# Patient Record
Sex: Male | Born: 1980 | Hispanic: No | Marital: Single | Smoking: Never smoker
Health system: Southern US, Community
[De-identification: ages and names within clinical notes are randomized; demographics above are authoritative.]

## PROBLEM LIST (undated history)

## (undated) DIAGNOSIS — L8990 Pressure ulcer of unspecified site, unspecified stage: Secondary | ICD-10-CM

## (undated) DIAGNOSIS — L089 Local infection of the skin and subcutaneous tissue, unspecified: Secondary | ICD-10-CM

## (undated) HISTORY — DX: Pressure ulcer of unspecified site, unspecified stage: L89.90

## (undated) HISTORY — DX: Local infection of the skin and subcutaneous tissue, unspecified: L08.9

---

## 2011-10-06 ENCOUNTER — Emergency Department (HOSPITAL_COMMUNITY)
Admission: EM | Admit: 2011-10-06 | Discharge: 2011-10-07 | Disposition: A | Discharge status: Home / Self Care | Payer: Medicaid Other | Attending: Emergency Medicine | Admitting: Emergency Medicine

## 2011-10-06 ENCOUNTER — Encounter: Payer: Self-pay | Admitting: Emergency Medicine

## 2011-10-06 DIAGNOSIS — J159 Unspecified bacterial pneumonia: Secondary | ICD-10-CM | POA: Insufficient documentation

## 2011-10-06 DIAGNOSIS — R509 Fever, unspecified: Secondary | ICD-10-CM | POA: Insufficient documentation

## 2011-10-06 DIAGNOSIS — R05 Cough: Secondary | ICD-10-CM | POA: Insufficient documentation

## 2011-10-06 DIAGNOSIS — IMO0001 Reserved for inherently not codable concepts without codable children: Secondary | ICD-10-CM | POA: Insufficient documentation

## 2011-10-06 DIAGNOSIS — R059 Cough, unspecified: Secondary | ICD-10-CM | POA: Insufficient documentation

## 2011-10-06 MED ORDER — ACETAMINOPHEN 325 MG PO TABS
ORAL_TABLET | ORAL | Status: AC
  Administered 2011-10-06: 650 mg via ORAL
  Filled 2011-10-06: qty 2

## 2011-10-06 MED ORDER — ACETAMINOPHEN 325 MG PO TABS
650.0000 mg | ORAL_TABLET | Freq: Once | ORAL | Status: AC
  Administered 2011-10-06: 650 mg via ORAL

## 2011-10-06 NOTE — ED Notes (Signed)
PT. REPORTS FEVER WITH BODY ACHES ,  DRY COUGH AND LOW BACK PAIN ONSET LAST NIGHT.

## 2011-10-07 ENCOUNTER — Emergency Department (HOSPITAL_COMMUNITY): Payer: Medicaid Other

## 2011-10-07 MED ORDER — AMOXICILLIN 500 MG PO CAPS: 500.0000 mg | ORAL_CAPSULE | Freq: Three times a day (TID) | ORAL | Status: AC

## 2011-10-07 MED ORDER — AZITHROMYCIN 250 MG PO TABS: 250.0000 mg | ORAL_TABLET | Freq: Every day | ORAL | Status: AC

## 2011-10-07 MED ORDER — AMOXICILLIN 500 MG PO CAPS
500.0000 mg | ORAL_CAPSULE | Freq: Once | ORAL | Status: AC
  Administered 2011-10-07: 500 mg via ORAL
  Filled 2011-10-07: qty 1

## 2011-10-07 MED ORDER — ACETAMINOPHEN 325 MG PO TABS
650.0000 mg | ORAL_TABLET | Freq: Once | ORAL | Status: AC
  Administered 2011-10-07: 650 mg via ORAL
  Filled 2011-10-07: qty 1

## 2011-10-07 MED ORDER — AZITHROMYCIN 250 MG PO TABS
500.0000 mg | ORAL_TABLET | Freq: Once | ORAL | Status: AC
  Administered 2011-10-07: 500 mg via ORAL
  Filled 2011-10-07: qty 1

## 2011-10-07 NOTE — ED Provider Notes (Signed)
History     CSN: [id number] Arrival date & time: 10/06/2011 10:39 PM   First MD Initiated Contact with Patient 10/06/11 2357      Chief Complaint  Patient presents with  . Fever    (Consider location/radiation/quality/duration/timing/severity/associated sxs/prior treatment) Patient is a 30 y.o. male presenting with fever. The history is provided by the patient.  Fever Primary symptoms of the febrile illness include fever and cough. Primary symptoms do not include fatigue, visual change, headaches, abdominal pain, nausea, vomiting, diarrhea or dysuria. The current episode started yesterday. This is a new problem.    Pt is here with Translator. Pt is from [place] and has been here for four years. He is having cough, chills, muscles aches, fever. He denies vomiting, diarrhea, abdominal pain, CP, and SOB.   History reviewed. No pertinent past medical history.  History reviewed. No pertinent past surgical history.  No family history on file.  History  Substance Use Topics  . Smoking status: Never Smoker   . Smokeless tobacco: Not on file  . Alcohol Use: No      Review of Systems  Constitutional: Positive for fever. Negative for fatigue.  Respiratory: Positive for cough.   Gastrointestinal: Negative for nausea, vomiting, abdominal pain and diarrhea.  Genitourinary: Negative for dysuria.  Neurological: Negative for headaches.  All other systems reviewed and are negative.    Allergies  Review of patient's allergies indicates no known allergies.  Home Medications   Current Outpatient Rx  Name Route Sig Dispense Refill  . ACETAMINOPHEN 325 MG PO TABS Oral Take 650 mg by mouth every 6 (six) hours as needed. For fever/pain       BP 123/72  Pulse 122  Temp(Src) 102.2 F (39 C) (Oral)  Resp 18  SpO2 97%  Physical Exam  Nursing note and vitals reviewed. Constitutional: He is oriented to person, place, and time. He appears well-developed and well-nourished. No  distress (pt is resting quitely.).  HENT:  Head: Normocephalic and atraumatic.  Eyes: EOM are normal. Pupils are equal, round, and reactive to light.  Neck: Normal range of motion.  Cardiovascular: Normal rate and regular rhythm.   Pulmonary/Chest: Effort normal and breath sounds normal. No respiratory distress. He has no wheezes. He has no rales. He exhibits no tenderness.  Abdominal: Soft. Bowel sounds are normal.  Musculoskeletal: Normal range of motion.  Neurological: He is alert and oriented to person, place, and time.  Skin: Skin is warm and dry.    ED Course  Procedures (including critical care time)  Labs Reviewed - No data to display Dg Chest 2 View  10/07/2011  *RADIOLOGY REPORT*  Clinical Data: Cough and fever  CHEST - 2 VIEW  Comparison: None.  Findings: Left upper lobe airspace disease could represent acute pneumonia or scarring.  This could also represent tuberculosis. Follow-up is suggested.  Comparison with prior studies would be useful if available.  Mild apical pleural thickening bilaterally due to scarring.  No pleural effusion.  Vascularity is normal.  IMPRESSION: Apical pleural scarring bilaterally.  Focal density left upper lobe laterally may represent scarring or infiltrate.  Comparison with prior studies would be useful.  Follow- up chest x-ray is suggested.  Original Report Authenticated By: Camelia Phenes, M.D.     No diagnosis found. 1. Community acquired pneumonia   MDM  Pt either has scarring or pneumonia of the lungs. Due to the patients symptoms I will treat him for pneumonia. Due to him being from another country,  I will treat broad spectrum with amox and azithro.        Dorthula Matas, PA 10/07/11 [phone]

## 2011-10-07 NOTE — ED Provider Notes (Signed)
Medical screening examination/treatment/procedure(s) were performed by non-physician practitioner and as supervising physician I was immediately available for consultation/collaboration.   Hanley Seamen, MD 10/07/11 [phone]

## 2012-03-05 ENCOUNTER — Encounter (HOSPITAL_COMMUNITY): Payer: Self-pay | Admitting: Emergency Medicine

## 2012-03-05 ENCOUNTER — Emergency Department (INDEPENDENT_AMBULATORY_CARE_PROVIDER_SITE_OTHER)
Admission: EM | Admit: 2012-03-05 | Discharge: 2012-03-05 | Disposition: A | Discharge status: Home / Self Care | Payer: Self-pay | Source: Home / Self Care | Attending: Family Medicine | Admitting: Family Medicine

## 2012-03-05 ENCOUNTER — Emergency Department (HOSPITAL_COMMUNITY)
Admission: EM | Admit: 2012-03-05 | Discharge: 2012-03-05 | Discharge status: Absent without leave | Payer: Self-pay | Source: Home / Self Care

## 2012-03-05 DIAGNOSIS — M545 Low back pain: Secondary | ICD-10-CM

## 2012-03-05 MED ORDER — TOBRAMYCIN 0.3 % OP OINT
TOPICAL_OINTMENT | OPHTHALMIC | Status: AC
  Filled 2012-03-05: qty 3.5

## 2012-03-05 MED ORDER — TOBRAMYCIN 0.3 % OP OINT
TOPICAL_OINTMENT | Freq: Two times a day (BID) | OPHTHALMIC | Status: DC
  Administered 2012-03-05 (×2): via OPHTHALMIC

## 2012-03-05 MED ORDER — CYCLOBENZAPRINE HCL 10 MG PO TABS: 10.0000 mg | ORAL_TABLET | Freq: Three times a day (TID) | ORAL | Status: AC | PRN

## 2012-03-05 MED ORDER — IBUPROFEN 600 MG PO TABS: 600.0000 mg | ORAL_TABLET | Freq: Three times a day (TID) | ORAL | Status: AC | PRN

## 2012-03-05 NOTE — ED Notes (Signed)
Translator stated, he's had a hurt back for a month. No injury, its his lower back

## 2012-03-05 NOTE — Discharge Instructions (Signed)
Take the prescribed medications as instructed. Be aware that Flexeril can make you drowsy and he should not drive or operate machinery after taking this medication.  Take ibuprofen with food as it can upset your stomach can combine with Tylenol over-the-counter every 6 hours as needed for pain . Start doing stretching exercises as per handout as soon as pain improves. Can do exercises at least 3 times a day; start with few repetitions up to 10 repetitions of each exercise. Return if persistent symptoms after 1 week despite following treatment or return earlier if worsening or new symptoms.

## 2012-03-05 NOTE — ED Provider Notes (Signed)
History     CSN: [id number]  Arrival date & time 03/05/12  Paulo Fruit   First MD Initiated Contact with Patient 03/05/12 1931      Chief Complaint  Patient presents with  . Back Pain    (Consider location/radiation/quality/duration/timing/severity/associated sxs/prior treatment) HPI Comments: 31 y/o male from [place]. Here c/o low back pain for moths. 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 No taking any medication for pain. Pain does not radiates to lower extremities or abdomen. No dysuria or hematuria. No fever or chills. Patient is very thin but denies sudden weight loss. No diarrhea, nausea or vomiting no abdominal pain.    History reviewed. No pertinent past medical history.  History reviewed. No pertinent past surgical history.  No family history on file.  History  Substance Use Topics  . Smoking status: Never Smoker   . Smokeless tobacco: Not on file  . Alcohol Use: No      Review of Systems  Constitutional:       10 systems reviewed and  pertinent negative and positive symptoms and as per HPI.     Cardiovascular: Negative for leg swelling.  Gastrointestinal: Negative for abdominal pain.  Genitourinary: Negative for dysuria and hematuria.  Musculoskeletal: Positive for back pain. Negative for gait problem.  Skin: Negative for rash.  Neurological: Negative for weakness, numbness and headaches.  All other systems reviewed and are negative.    Allergies  Review of patient's allergies indicates no known allergies.  Home Medications   Current Outpatient Rx  Name Route Sig Dispense Refill  . ACETAMINOPHEN 325 MG PO TABS Oral Take 650 mg by mouth every 6 (six) hours as needed. For fever/pain     . CYCLOBENZAPRINE HCL 10 MG PO TABS Oral Take 1 tablet (10 mg total) by mouth 3 (three) times daily as needed for muscle spasms. 20 tablet 0  .  IBUPROFEN 600 MG PO TABS Oral Take 1 tablet (600 mg total) by mouth every 8 (eight) hours as needed for pain. 15 tablet 0    BP 108/72  Pulse 75  Temp(Src) 98.5 F (36.9 C) (Oral)  Resp 15  SpO2 97%  Physical Exam  Nursing note and vitals reviewed. Constitutional: He is oriented to person, place, and time. He appears well-developed and well-nourished. No distress.  HENT:  Head: Normocephalic and atraumatic.  Mouth/Throat: No oropharyngeal exudate.  Eyes: Conjunctivae are normal. Pupils are equal, round, and reactive to light. No scleral icterus.  Neck: Normal range of motion. Neck supple.  Cardiovascular: Normal rate, regular rhythm and normal heart sounds.   Pulmonary/Chest: Effort normal and breath sounds normal.  Abdominal: Soft. He exhibits no distension and no mass. There is no tenderness. There is no rebound and no guarding.  Musculoskeletal:       Spine appears central with fair anteflexion and posterior extension range of motion.  No tenderness over spinal processes.  Increased tone and bilateral tenderness in low lumbar paravertebral muscles.  Neurological: He is alert and oriented to person, place, and time. He has normal strength and normal reflexes. No sensory deficit. He exhibits normal muscle tone.  Reflex Scores:      Patellar reflexes are 2+ on the right side and 2+ on the left side.      Achilles reflexes are 2+ on the right side and 2+ on the left side.  Intact sensation in lower extremities.  Skin: No rash noted.    ED Course  Procedures (including critical care time)  Labs Reviewed - No data to display No results found.   1. Low back ache       MDM  Treated symptomatically with muscle relaxant, ibuprofen and hand out for back exercises.        Sharin Grave, MD 03/06/12 1213

## 2013-01-25 IMAGING — CR DG CHEST 2V
2 series · 2 of 2 positions shown · non-contrast
Comparison: None.

CLINICAL DATA: Cough and fever

CHEST - 2 VIEW

[w chest pa]
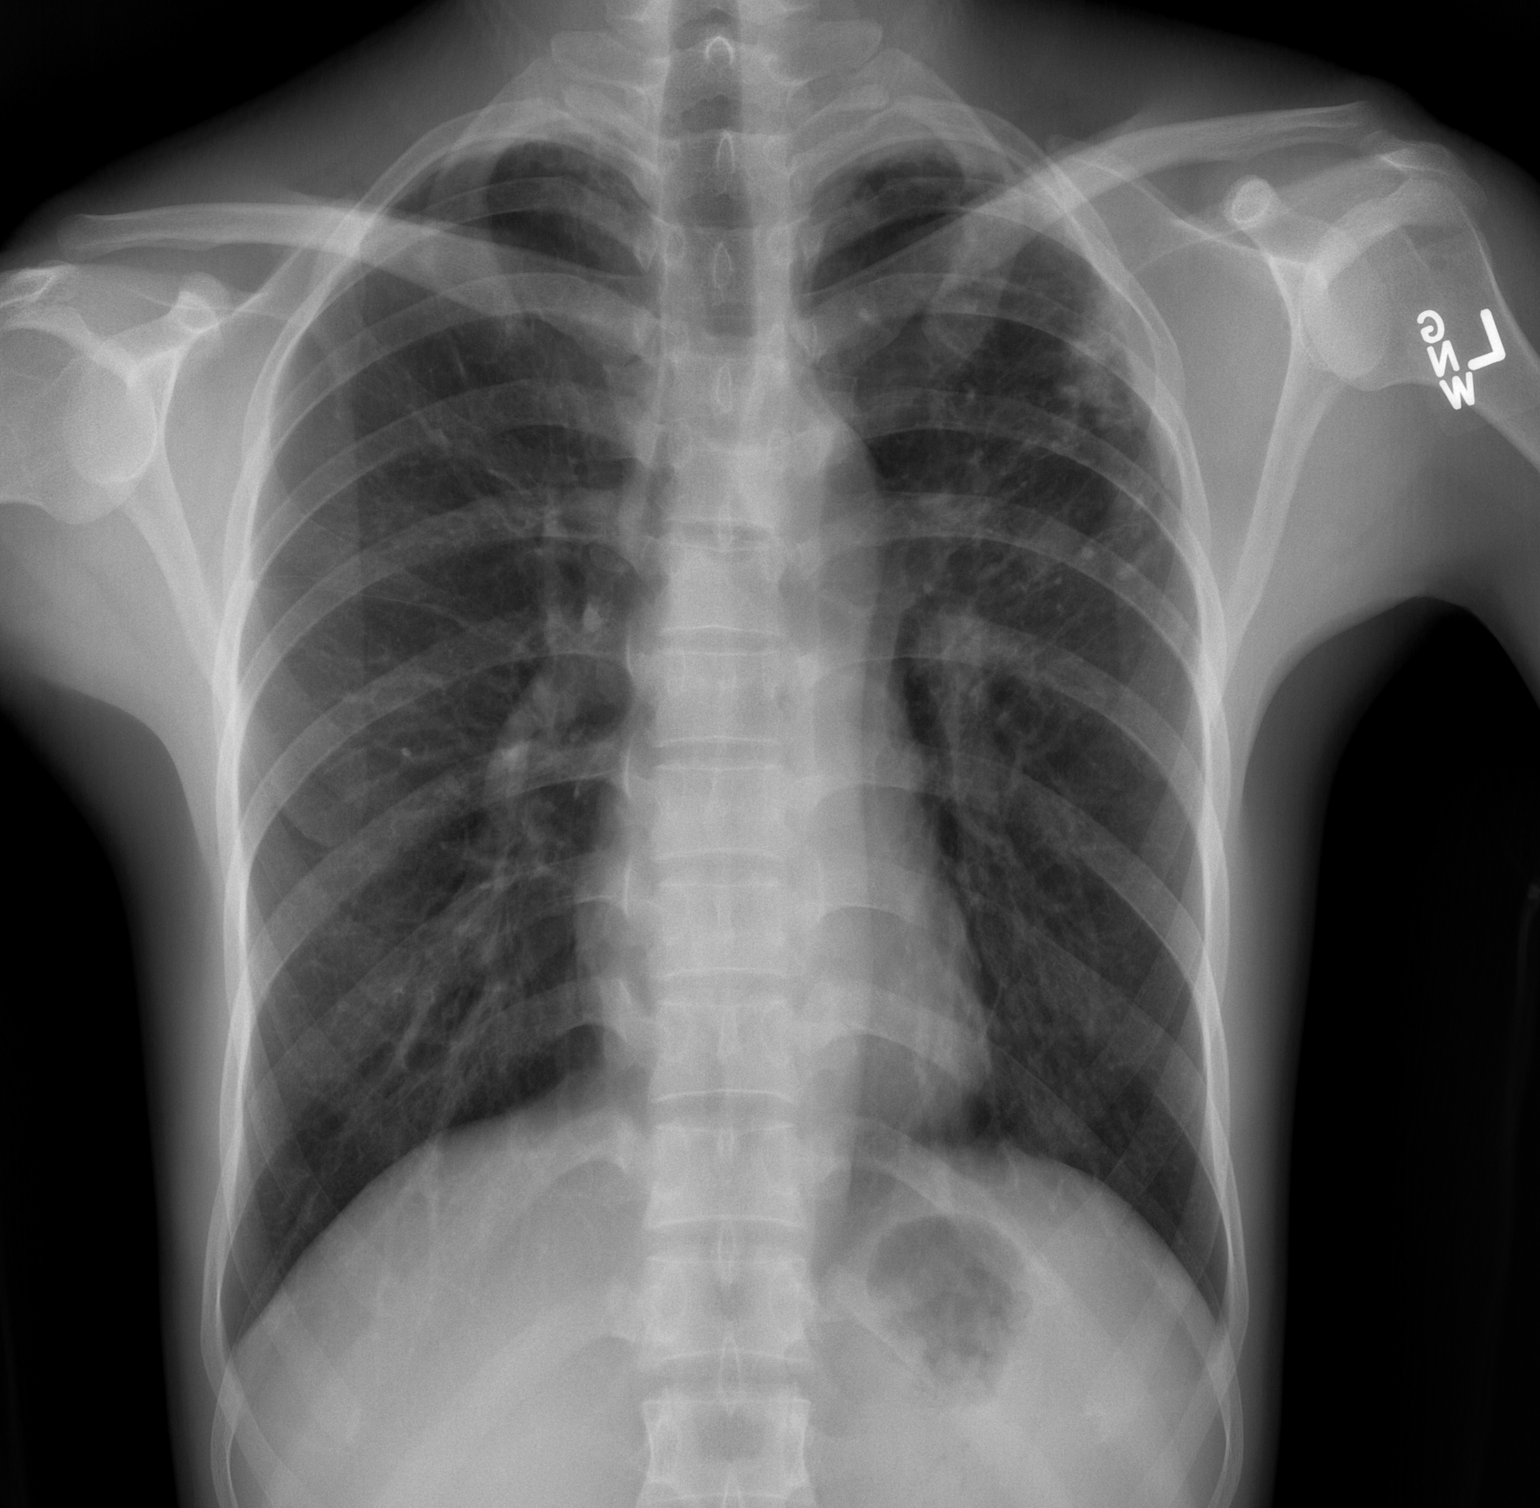

[w chest lat]
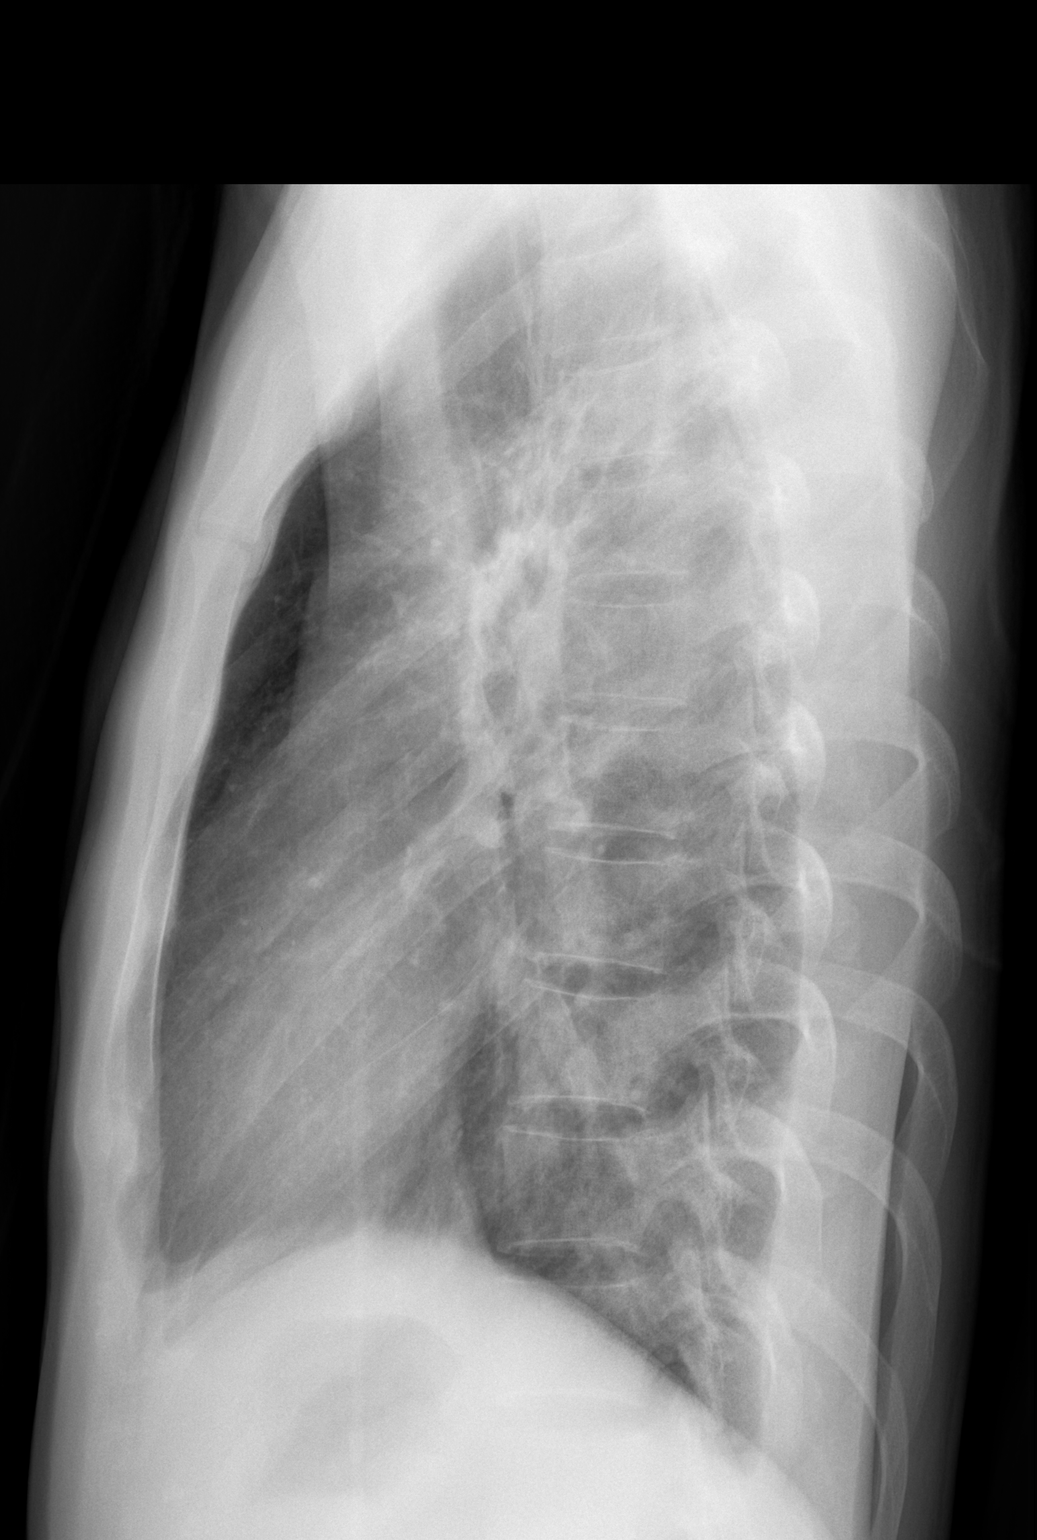

[2 of 2 positions shown; findings below may reference images not displayed]

FINDINGS: Left upper lobe airspace disease could represent acute
pneumonia or scarring.  This could also represent tuberculosis.
Follow-up is suggested.  Comparison with prior studies would be
useful if available.  Mild apical pleural thickening bilaterally
due to scarring.  No pleural effusion.  Vascularity is normal.
IMPRESSION: Apical pleural scarring bilaterally.

Focal density left upper lobe laterally may represent scarring or
infiltrate.  Comparison with prior studies would be useful.  Follow-
up chest x-ray is suggested.

## 2016-08-23 ENCOUNTER — Encounter: Payer: Self-pay | Admitting: Internal Medicine

## 2016-08-23 ENCOUNTER — Ambulatory Visit (INDEPENDENT_AMBULATORY_CARE_PROVIDER_SITE_OTHER): Payer: Self-pay | Admitting: Internal Medicine

## 2016-08-23 VITALS — BP 100/70 | HR 70 | Resp 16 | Ht 65.0 in | Wt 90.5 lb

## 2016-08-23 DIAGNOSIS — G8929 Other chronic pain: Secondary | ICD-10-CM

## 2016-08-23 DIAGNOSIS — M545 Low back pain, unspecified: Secondary | ICD-10-CM | POA: Insufficient documentation

## 2016-08-23 DIAGNOSIS — K029 Dental caries, unspecified: Secondary | ICD-10-CM | POA: Insufficient documentation

## 2016-08-23 DIAGNOSIS — R636 Underweight: Secondary | ICD-10-CM

## 2016-08-23 NOTE — Progress Notes (Signed)
Subjective:    Patient ID: Roger Bailey, male    DOB: 05/27/1981, 35 y.o.   MRN: [id number]  HPI   Here to establish  1.  Bilateral Lower back pain for 1 year.  [place] CHW gives a history shared by his mother of when he was very young, he was very sick to the point where they could not pick him up, but only leave him on the ground.  Reportedly, developed an infection on his back where the bone actually could be seen. Patient states he knows little about this history.  Denies any radiation of pain. Denies numbness, tingling or weakness of legs.  States the pain is intermittent. Pain more prominent when stands or if walks too long.   Sometimes, can keep him from sleep at night. Has not been evaluated for this elsewhere. 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  Stopped working there 3 years ago and worked there only 1 month. Clarification that the pain started in 2014 when he worked at [employer].   Has taken medication, which helps, but pain recurs when stops the medication.  2.  Weight concerns:  States he is never hungry and only eats once daily at 5 p.m. Rice or bread, vegetables, sometimes meat.    No outpatient prescriptions have been marked as taking for the 08/23/16 encounter (Office Visit) with Julieanne MansonElizabeth Brannen Koppen, MD.   No Known Allergies   Past Medical History:  Diagnosis Date  . Infected decubitus ulcer young child   Not clear if this is what patient suffered as child   History reviewed. No pertinent surgical history.   Social History   Social History  . Marital status: Single    Spouse name: N/A  . Number of children: 0  . Years of education: 8   Occupational History  . unemployed     Supported by parents   Social History Main Topics  . Smoking status: Never Smoker  . Smokeless tobacco: Current User    Types: Chew     Comment: Has chewed tobacco since he was very young.  . Alcohol use No  . Drug use: No  . Sexual  activity: Not on file   Other Topics Concern  . Not on file   Social History Narrative   Lives with parents [name] and [name] as well as his sister and her nuclear family   Supported by parents.   Has never had a significant other.   Family History  Problem Relation Age of Onset  . Hyperlipidemia Mother   . Hypertension Mother   . Deafness Sister        Review of Systems   Denies constipation or diarrhea     Objective:   Physical Exam   HEENT:  PERRL, EOMI, discs sharp.  Right lower lid edge with 0.2 mm swelling and redness midway across.  Small erythematous bleb at edge of lid as well. Dental decay with brown staining.  Throat without injection, TMs pearly gray. Lungs:  CTA CV:  RRR with normal S1 and S2, No S3, S4 or murmur.  Radial and DP pulses normal and equal. Abd:  S, NT, No HSM or mass, + BS Back: NT over spinous processes, but tender over superior posterior pelvic brim bilaterally and lateral aspect of paraspinous musculature. Neuro:  LE:  Motor 5/5, DTRs 2+/4 throughout.  Pinprick sensation normal.       Assessment & Plan:  1.  Blepharitis, right eyelid:  Blepharitis care discussed  2.  Weight Concern:  Lengthy discussion regarding diet.  Suspect unlikely patient will take in more calories. Will continue to encourage increased healthy caloric intake.  Check CBC CMP, TSH [facility]  3.  Back pain:  Sounds unrelated to his history of open ulcerative like lesion described from his young childhood.  Check L/S spine.  Referral to PT.  Does not seem to require or want pain medication other than Tylenol. Follow up in 3 months  4.  Dental decay:  Discussed stopping tobacco and nut chewing.  Referral to dental clinic.

## 2016-08-23 NOTE — Patient Instructions (Signed)
Eat 3 meals daily:  Vegetables, rice and meat. 2 pieces of fruit daily Milk with each meal-2%

## 2016-08-24 LAB — CBC WITH DIFFERENTIAL/PLATELET
BASOS ABS: 0 10*3/uL (ref 0.0–0.2)
Basos: 1 %
EOS (ABSOLUTE): 0.1 10*3/uL (ref 0.0–0.4)
Eos: 1 %
Hematocrit: 42.9 % (ref 37.5–51.0)
Hemoglobin: 15.2 g/dL (ref 12.6–17.7)
IMMATURE GRANULOCYTES: 0 %
Immature Grans (Abs): 0 10*3/uL (ref 0.0–0.1)
Lymphocytes Absolute: 1.2 10*3/uL (ref 0.7–3.1)
Lymphs: 19 %
MCH: 29.9 pg (ref 26.6–33.0)
MCHC: 35.4 g/dL (ref 31.5–35.7)
MCV: 84 fL (ref 79–97)
MONOS ABS: 0.4 10*3/uL (ref 0.1–0.9)
Monocytes: 7 %
NEUTROS PCT: 72 %
Neutrophils Absolute: 4.6 10*3/uL (ref 1.4–7.0)
PLATELETS: 150 10*3/uL (ref 150–379)
RBC: 5.08 x10E6/uL (ref 4.14–5.80)
RDW: 12.9 % (ref 12.3–15.4)
WBC: 6.2 10*3/uL (ref 3.4–10.8)

## 2016-08-24 LAB — COMPREHENSIVE METABOLIC PANEL
A/G RATIO: 1.5 (ref 1.2–2.2)
ALT: 11 IU/L (ref 0–44)
AST: 14 IU/L (ref 0–40)
Albumin: 4.7 g/dL (ref 3.5–5.5)
Alkaline Phosphatase: 68 IU/L (ref 39–117)
BUN/Creatinine Ratio: 12 (ref 9–20)
BUN: 9 mg/dL (ref 6–20)
Bilirubin Total: 0.4 mg/dL (ref 0.0–1.2)
CALCIUM: 9.1 mg/dL (ref 8.7–10.2)
CO2: 24 mmol/L (ref 18–29)
Chloride: 99 mmol/L (ref 96–106)
Creatinine, Ser: 0.73 mg/dL — ABNORMAL LOW (ref 0.76–1.27)
GFR calc Af Amer: 141 mL/min/{1.73_m2} (ref >59)
GFR, EST NON AFRICAN AMERICAN: 122 mL/min/{1.73_m2} (ref >59)
Globulin, Total: 3.1 g/dL (ref 1.5–4.5)
Glucose: 88 mg/dL (ref 65–99)
POTASSIUM: 4.2 mmol/L (ref 3.5–5.2)
Sodium: 137 mmol/L (ref 134–144)
Total Protein: 7.8 g/dL (ref 6.0–8.5)

## 2016-08-24 LAB — TSH: TSH: 0.72 u[IU]/mL (ref 0.450–4.500)

## 2016-10-23 ENCOUNTER — Encounter: Payer: Self-pay | Admitting: Internal Medicine

## 2016-10-23 DIAGNOSIS — L089 Local infection of the skin and subcutaneous tissue, unspecified: Secondary | ICD-10-CM | POA: Insufficient documentation

## 2016-10-23 DIAGNOSIS — L8990 Pressure ulcer of unspecified site, unspecified stage: Secondary | ICD-10-CM

## 2018-12-24 ENCOUNTER — Emergency Department (HOSPITAL_COMMUNITY)
Admission: EM | Admit: 2018-12-24 | Discharge: 2018-12-24 | Disposition: A | Discharge status: Home / Self Care | Payer: Self-pay | Attending: Emergency Medicine | Admitting: Emergency Medicine

## 2018-12-24 ENCOUNTER — Other Ambulatory Visit: Payer: Self-pay

## 2018-12-24 ENCOUNTER — Emergency Department (HOSPITAL_COMMUNITY): Payer: Self-pay

## 2018-12-24 DIAGNOSIS — F17290 Nicotine dependence, other tobacco product, uncomplicated: Secondary | ICD-10-CM | POA: Insufficient documentation

## 2018-12-24 DIAGNOSIS — R42 Dizziness and giddiness: Secondary | ICD-10-CM | POA: Insufficient documentation

## 2018-12-24 DIAGNOSIS — R0602 Shortness of breath: Secondary | ICD-10-CM | POA: Insufficient documentation

## 2018-12-24 LAB — I-STAT TROPONIN, ED: Troponin i, poc: 0 ng/mL (ref 0.00–0.08)

## 2018-12-24 LAB — COMPREHENSIVE METABOLIC PANEL
ALBUMIN: 4.4 g/dL (ref 3.5–5.0)
ALT: 12 U/L (ref 0–44)
AST: 20 U/L (ref 15–41)
Alkaline Phosphatase: 47 U/L (ref 38–126)
Anion gap: 10 (ref 5–15)
BUN: 9 mg/dL (ref 6–20)
CHLORIDE: 106 mmol/L (ref 98–111)
CO2: 22 mmol/L (ref 22–32)
Calcium: 9.1 mg/dL (ref 8.9–10.3)
Creatinine, Ser: 0.9 mg/dL (ref 0.61–1.24)
GFR calc non Af Amer: >60 mL/min (ref >60)
GLUCOSE: 87 mg/dL (ref 70–99)
Potassium: 3.7 mmol/L (ref 3.5–5.1)
SODIUM: 138 mmol/L (ref 135–145)
Total Bilirubin: 0.7 mg/dL (ref 0.3–1.2)
Total Protein: 7.6 g/dL (ref 6.5–8.1)

## 2018-12-24 LAB — CBC WITH DIFFERENTIAL/PLATELET
ABS IMMATURE GRANULOCYTES: 0.01 10*3/uL (ref 0.00–0.07)
BASOS ABS: 0 10*3/uL (ref 0.0–0.1)
Basophils Relative: 1 %
EOS PCT: 1 %
Eosinophils Absolute: 0 10*3/uL (ref 0.0–0.5)
HCT: 44.5 % (ref 39.0–52.0)
HEMOGLOBIN: 14.8 g/dL (ref 13.0–17.0)
IMMATURE GRANULOCYTES: 0 %
Lymphocytes Relative: 27 %
Lymphs Abs: 1.3 10*3/uL (ref 0.7–4.0)
MCH: 29.6 pg (ref 26.0–34.0)
MCHC: 33.3 g/dL (ref 30.0–36.0)
MCV: 89 fL (ref 80.0–100.0)
MONO ABS: 0.4 10*3/uL (ref 0.1–1.0)
Monocytes Relative: 7 %
Neutro Abs: 3.2 10*3/uL (ref 1.7–7.7)
Neutrophils Relative %: 64 %
PLATELETS: 147 10*3/uL — AB (ref 150–400)
RBC: 5 MIL/uL (ref 4.22–5.81)
RDW: 12.8 % (ref 11.5–15.5)
WBC: 4.9 10*3/uL (ref 4.0–10.5)
nRBC: 0 % (ref 0.0–0.2)

## 2018-12-24 LAB — D-DIMER, QUANTITATIVE (NOT AT ARMC)

## 2018-12-24 LAB — LIPASE, BLOOD: Lipase: 26 U/L (ref 11–51)

## 2018-12-24 MED ORDER — MECLIZINE HCL 12.5 MG PO TABS: 25.0000 mg | ORAL_TABLET | Freq: Two times a day (BID) | ORAL | 0 refills | Status: AC

## 2018-12-24 NOTE — ED Provider Notes (Signed)
[facility] EMERGENCY DEPARTMENT Provider Note   CSN: [id number] Arrival date & time: 12/24/18  1746    History   Chief Complaint Chief Complaint  Patient presents with  . Sore Throat  . Shortness of Breath    HPI Roger Bailey is a 38 y.o. male.     38 y.o male with no PMH presents to the ED with a chief complaint of shortness, dizziness x 2 weeks. Patient reports dizziness describes the room like its spinning worse at night. He also reports shortness feeling like he's going to pass out.  Patient also reports a subjective fever, unknown due to not having a thermometer at home.  He has not tried any medical therapy for relieving symptoms.  He also endorses chest pressure located in the center his chest along with radiation to the left.  So endorses some abdominal pain very vague in symptoms.  Patient history was limited due to language barrier, visual and auditory interpreter was called without success.  History provided by family member at the bedside.     Past Medical History:  Diagnosis Date  . Infected decubitus ulcer young child   Not clear if this is what patient suffered as child    Patient Active Problem List   Diagnosis Date Noted  . Infected decubitus ulcer   . Low back pain 08/23/2016  . Dental decay 08/23/2016    No past surgical history on file.      Home Medications    Prior to Admission medications   Medication Sig Start Date End Date Taking? Authorizing Provider  meclizine (ANTIVERT) 12.5 MG tablet Take 2 tablets (25 mg total) by mouth 2 (two) times daily for 7 days. 12/24/18 12/31/18  Claude Manges, PA-C    Family History Family History  Problem Relation Age of Onset  . Hyperlipidemia Mother   . Hypertension Mother   . Deafness Sister     Social History Social History   Tobacco Use  . Smoking status: Never Smoker  . Smokeless tobacco: Current User    Types: Chew  . Tobacco comment: Has chewed tobacco since he was very young.    Substance Use Topics  . Alcohol use: No  . Drug use: No     Allergies   Patient has no known allergies.   Review of Systems Review of Systems  Constitutional: Negative for chills and fever.  HENT: Negative for ear pain and sore throat.   Eyes: Negative for pain and visual disturbance.  Respiratory: Positive for shortness of breath. Negative for cough.   Cardiovascular: Positive for chest pain. Negative for palpitations.  Gastrointestinal: Negative for abdominal pain, diarrhea, nausea and vomiting.  Genitourinary: Negative for dysuria and hematuria.  Musculoskeletal: Negative for arthralgias and back pain.  Skin: Negative for color change and rash.  Neurological: Positive for dizziness and light-headedness. Negative for seizures and syncope.  All other systems reviewed and are negative.    Physical Exam Updated Vital Signs BP (!) 111/91 (BP Location: Left Arm)   Pulse 80   Temp 98.2 F (36.8 C) (Oral)   Resp 16   SpO2 100%   Physical Exam Vitals signs and nursing note reviewed.  Constitutional:      Appearance: He is well-developed.  HENT:     Head: Normocephalic and atraumatic.  Eyes:     General: No scleral icterus.    Pupils: Pupils are equal, round, and reactive to light.  Neck:     Musculoskeletal: Normal range of  motion.  Cardiovascular:     Heart sounds: Normal heart sounds.  Pulmonary:     Effort: Pulmonary effort is normal.     Breath sounds: Normal breath sounds. No wheezing.  Chest:     Chest wall: No tenderness.  Abdominal:     General: Bowel sounds are normal. There is no distension.     Palpations: Abdomen is soft.     Tenderness: There is no abdominal tenderness.  Musculoskeletal:        General: No tenderness or deformity.  Skin:    General: Skin is warm and dry.  Neurological:     Mental Status: He is alert and oriented to person, place, and time.      ED Treatments / Results  Labs (all labs ordered are listed, but only abnormal  results are displayed) Labs Reviewed  CBC WITH DIFFERENTIAL/PLATELET - Abnormal; Notable for the following components:      Result Value   Platelets 147 (*)    All other components within normal limits  COMPREHENSIVE METABOLIC PANEL  LIPASE, BLOOD  D-DIMER, QUANTITATIVE (NOT AT [facility])  I-STAT TROPONIN, ED    EKG None  Radiology Dg Chest 2 View  Result Date: 12/24/2018 CLINICAL DATA:  Shortness of breath for 2 weeks. EXAM: CHEST - 2 VIEW COMPARISON:  Radiographs 10/07/2011 FINDINGS: Chronic biapical pleuroparenchymal scarring, left greater than right. Chronic hyperinflation. No acute airspace disease. Normal heart size with unchanged mediastinal contours, slight left hilar retraction is chronic. No pulmonary edema, pleural effusion, or pneumothorax. No acute osseous abnormalities. IMPRESSION: Chronic hyperinflation and biapical pleuroparenchymal scarring. No acute cardiopulmonary abnormality. Electronically Signed   By: Narda RutherfordMelanie  Sanford M.D.   On: 12/24/2018 22:03    Procedures Procedures (including critical care time)  Medications Ordered in ED Medications - No data to display   Initial Impression / Assessment and Plan / ED Course  I have reviewed the triage vital signs and the nursing notes.  Pertinent labs & imaging results that were available during my care of the patient were reviewed by me and considered in my medical decision making (see chart for details).       Patient presents with complaints of dizziness along with shortness of breath x1 week.  History was limited by language barrier, attempted to use interpreter via visual and auditory but unsuccessful.  CBC showed no leukocytosis, hemoglobin is stable.  CMP showed no electrolyte normality, LFTs are within normal limits.  First troponin was negative, lipase was within normal limits.  EKG showed no changes consistent with ischemia or STEMI.  D-dimer was negative.  X-ray was obtained which showed: Chronic hyperinflation  and biapical pleuroparenchymal scarring. No  acute cardiopulmonary abnormality.       Suspect patient's dizziness is due to vertigo as he reports worsening with movement along with standing and sitting up.  We will have him try Antivert to help with his symptoms. Suspect patient likely have underlying COPD.  However, in the ED he is satting at 100% on room air.  Sure of the cause of his dizziness will give him some Antivert to try at home.  He was ambulatory with a steady gait during arrival.  Is any weakness on upper or lower extremities, low suspicion for any infarct.  Have patient follow-up with [place] [facility] further management and evaluation of his COPD.  History was limited by language barrier, translator use was niece at the bedside.  Final Clinical Impressions(s) / ED Diagnoses   Final diagnoses:  Shortness of breath  Vertigo    ED Discharge Orders         Ordered    meclizine (ANTIVERT) 12.5 MG tablet  2 times daily     12/24/18 2304           Claude Manges, PA-C 12/24/18 2305    Virgina Norfolk, DO 12/25/18 0120

## 2018-12-24 NOTE — ED Notes (Signed)
Patient transported to X-ray 

## 2018-12-24 NOTE — Discharge Instructions (Addendum)
I have prescribed medication that should help with your dizziness.  Your x-ray today showed some enlargement of your lung fields, you will need to follow-up with the [facility] for further evaluation of your likely COPD.  Please return to the ED if experience any chest pain, shortness of breath or worsening symptoms.

## 2018-12-24 NOTE — ED Triage Notes (Signed)
Pt here for evaluation of sore throat and subjective difficulty breathing x 2 weeks. Denies pain at present. Pt speaks Karenni.

## 2020-02-12 ENCOUNTER — Ambulatory Visit: Payer: Self-pay | Attending: Internal Medicine

## 2020-02-12 DIAGNOSIS — Z23 Encounter for immunization: Secondary | ICD-10-CM

## 2020-02-12 NOTE — Progress Notes (Signed)
   Covid-19 Vaccination Clinic  Name:  Lamark Schue    MRN: [id number] DOB: 1980-11-19  02/12/2020  Mr. Burston was observed post Covid-19 immunization for 15 minutes without incident. He was provided with Vaccine Information Sheet and instruction to access the V-Safe system.   Mr. Basnett was instructed to call 911 with any severe reactions post vaccine: Marland Kitchen Difficulty breathing  . Swelling of face and throat  . A fast heartbeat  . A bad rash all over body  . Dizziness and weakness   Immunizations Administered    Name Date Dose VIS Date Route   Pfizer COVID-19 Vaccine 02/12/2020  3:55 PM 0.3 mL 12/17/2018 Intramuscular   Manufacturer: [employer], [employer]   Lot: [id number]   NDC: [address]-5

## 2020-03-08 ENCOUNTER — Ambulatory Visit: Payer: Self-pay | Attending: Internal Medicine

## 2020-03-08 DIAGNOSIS — Z23 Encounter for immunization: Secondary | ICD-10-CM

## 2020-03-08 NOTE — Progress Notes (Signed)
   Covid-19 Vaccination Clinic  Name:  Roger Bailey    MRN: [id number] DOB: 1981-03-03  03/08/2020  [name] was observed post Covid-19 immunization for 15 minutes without incident. He was provided with Vaccine Information Sheet and instruction to access the V-Safe system.   [name] was instructed to call 911 with any severe reactions post vaccine: Marland Kitchen Difficulty breathing  . Swelling of face and throat  . A fast heartbeat  . A bad rash all over body  . Dizziness and weakness   Immunizations Administered    Name Date Dose VIS Date Route   Pfizer COVID-19 Vaccine 03/08/2020  3:17 PM 0.3 mL 12/17/2018 Intramuscular   Manufacturer: [employer], [employer]   Lot: [id number]   NDC: [address]-7
